# Patient Record
Sex: Female | Born: 1975 | Race: Black or African American | Hispanic: No | Marital: Single | Smoking: Current every day smoker
Health system: Southern US, Community
[De-identification: ages and names within clinical notes are randomized; demographics above are authoritative.]

## PROBLEM LIST (undated history)

## (undated) HISTORY — PX: ABDOMINAL SURGERY: SHX537

---

## 2016-03-12 ENCOUNTER — Encounter (HOSPITAL_COMMUNITY): Payer: Self-pay | Admitting: *Deleted

## 2016-03-12 ENCOUNTER — Emergency Department (HOSPITAL_COMMUNITY)
Admission: EM | Admit: 2016-03-12 | Discharge: 2016-03-13 | Disposition: A | Discharge status: Home / Self Care | Payer: Self-pay | Attending: Emergency Medicine | Admitting: Emergency Medicine

## 2016-03-12 DIAGNOSIS — M5442 Lumbago with sciatica, left side: Secondary | ICD-10-CM | POA: Insufficient documentation

## 2016-03-12 DIAGNOSIS — M5432 Sciatica, left side: Secondary | ICD-10-CM

## 2016-03-12 DIAGNOSIS — Y9301 Activity, walking, marching and hiking: Secondary | ICD-10-CM | POA: Insufficient documentation

## 2016-03-12 DIAGNOSIS — Y990 Civilian activity done for income or pay: Secondary | ICD-10-CM | POA: Insufficient documentation

## 2016-03-12 DIAGNOSIS — Y9289 Other specified places as the place of occurrence of the external cause: Secondary | ICD-10-CM | POA: Insufficient documentation

## 2016-03-12 DIAGNOSIS — W19XXXA Unspecified fall, initial encounter: Secondary | ICD-10-CM | POA: Insufficient documentation

## 2016-03-12 DIAGNOSIS — F17210 Nicotine dependence, cigarettes, uncomplicated: Secondary | ICD-10-CM | POA: Insufficient documentation

## 2016-03-12 LAB — COMPREHENSIVE METABOLIC PANEL
ALK PHOS: 48 U/L (ref 38–126)
ALT: 27 U/L (ref 14–54)
AST: 32 U/L (ref 15–41)
Albumin: 3.9 g/dL (ref 3.5–5.0)
Anion gap: 4 — ABNORMAL LOW (ref 5–15)
BUN: 9 mg/dL (ref 6–20)
CALCIUM: 8.8 mg/dL — AB (ref 8.9–10.3)
CO2: 25 mmol/L (ref 22–32)
CREATININE: 0.95 mg/dL (ref 0.44–1.00)
Chloride: 110 mmol/L (ref 101–111)
GFR calc non Af Amer: >60 mL/min (ref >60)
Glucose, Bld: 103 mg/dL — ABNORMAL HIGH (ref 65–99)
Potassium: 3.6 mmol/L (ref 3.5–5.1)
SODIUM: 139 mmol/L (ref 135–145)
Total Bilirubin: 0.7 mg/dL (ref 0.3–1.2)
Total Protein: 7 g/dL (ref 6.5–8.1)

## 2016-03-12 LAB — CBC WITH DIFFERENTIAL/PLATELET
Basophils Absolute: 0.1 10*3/uL (ref 0.0–0.1)
Basophils Relative: 1 %
Eosinophils Absolute: 0.2 10*3/uL (ref 0.0–0.7)
Eosinophils Relative: 3 %
HEMATOCRIT: 46.3 % — AB (ref 36.0–46.0)
HEMOGLOBIN: 15.6 g/dL — AB (ref 12.0–15.0)
LYMPHS ABS: 3.2 10*3/uL (ref 0.7–4.0)
LYMPHS PCT: 37 %
MCH: 30.6 pg (ref 26.0–34.0)
MCHC: 33.7 g/dL (ref 30.0–36.0)
MCV: 91 fL (ref 78.0–100.0)
Monocytes Absolute: 0.6 10*3/uL (ref 0.1–1.0)
Monocytes Relative: 7 %
NEUTROS ABS: 4.6 10*3/uL (ref 1.7–7.7)
NEUTROS PCT: 52 %
Platelets: 199 10*3/uL (ref 150–400)
RBC: 5.09 MIL/uL (ref 3.87–5.11)
RDW: 13.8 % (ref 11.5–15.5)
WBC: 8.7 10*3/uL (ref 4.0–10.5)

## 2016-03-12 LAB — URINALYSIS, ROUTINE W REFLEX MICROSCOPIC
GLUCOSE, UA: NEGATIVE mg/dL
Hgb urine dipstick: NEGATIVE
KETONES UR: 15 mg/dL — AB
NITRITE: NEGATIVE
PH: 6 (ref 5.0–8.0)
Protein, ur: NEGATIVE mg/dL
Specific Gravity, Urine: 1.035 — ABNORMAL HIGH (ref 1.005–1.030)

## 2016-03-12 LAB — URINE MICROSCOPIC-ADD ON: RBC / HPF: NONE SEEN RBC/hpf (ref 0–5)

## 2016-03-12 NOTE — ED Triage Notes (Signed)
No answer x3

## 2016-03-12 NOTE — ED Triage Notes (Signed)
Patient presents with c/o left leg hurting to the back of her knee.  States the pain gets so bad at times that she cannot sleep

## 2016-03-13 ENCOUNTER — Emergency Department (HOSPITAL_COMMUNITY): Payer: Self-pay

## 2016-03-13 LAB — PREGNANCY, URINE: Preg Test, Ur: NEGATIVE

## 2016-03-13 MED ORDER — NAPROXEN 500 MG PO TABS: 500.0000 mg | ORAL_TABLET | Freq: Two times a day (BID) | ORAL | 0 refills | Status: DC

## 2016-03-13 MED ORDER — NAPROXEN 250 MG PO TABS
500.0000 mg | ORAL_TABLET | Freq: Once | ORAL | Status: AC
Start: 2016-03-13 — End: 2016-03-13
  Administered 2016-03-13: 500 mg via ORAL
  Filled 2016-03-13: qty 2

## 2016-03-13 MED ORDER — HYDROCODONE-ACETAMINOPHEN 5-325 MG PO TABS: 1.0000 | ORAL_TABLET | ORAL | 0 refills | Status: DC | PRN

## 2016-03-13 NOTE — ED Notes (Signed)
The pt did not answer when her name was called  Now she is asking how long is the wait  She went out to her car to get something

## 2016-03-13 NOTE — ED Provider Notes (Signed)
[facility] DEPT Provider Note   CSN: [id number] Arrival date & time: 03/12/16  2003     History   Chief Complaint Chief Complaint  Patient presents with  . Leg Pain    HPI Victoria Browning is a 40 y.o. female.  Patient presents with complaint of pain in the left buttock that extends down the posterior left thigh to the proximal calf. No swelling or discoloration. No abdominal pain, urinary symptoms including no urinary incontinence or retention. She denies history of the same. 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 No numbness or weakness of the extremities.    The history is provided by the patient. No language interpreter was used.  Leg Pain   This is a new problem. The current episode started more than 1 week ago. The problem occurs constantly. The problem has been gradually worsening. Pertinent negatives include no numbness.    History reviewed. No pertinent past medical history.  There are no active problems to display for this patient.   Past Surgical History:  Procedure Laterality Date  . ABDOMINAL SURGERY      OB History    No data available       Home Medications    Prior to Admission medications   Not on File    Family History No family history on file.  Social History Social History  Substance Use Topics  . Smoking status: Current Every Day Smoker    Types: Cigarettes  . Smokeless tobacco: Never Used  . Alcohol use Yes     Comment: socially     Allergies   Review of patient's allergies indicates no known allergies.   Review of Systems Review of Systems  Constitutional: Negative for chills and fever.  HENT: Negative.   Respiratory: Negative.   Cardiovascular: Negative.   Gastrointestinal: Negative.   Musculoskeletal:       See HPI.  Skin: Negative.  Negative for color change and wound.  Neurological:  Negative.  Negative for weakness and numbness.     Physical Exam Updated Vital Signs BP 118/81   Pulse (!) 57   Temp 99 F (37.2 C) (Oral)   Resp 18   Ht 5\' 5"  (1.651 m)   Wt 99.8 kg   LMP 03/11/2016 Comment: spotting  transitioning taking shots  SpO2 99%   BMI 36.61 kg/m   Physical Exam  Constitutional: She is oriented to person, place, and time. She appears well-developed and well-nourished.  Neck: Normal range of motion.  Pulmonary/Chest: Effort normal.  Abdominal: There is no tenderness.  Musculoskeletal: Normal range of motion.  Left leg has a preserved ROM. There is no swelling, redness or deformity. Tender to left buttock that causes radiating pain into the left leg. Minor lumbar and left paralumbar tenderness.   Neurological: She is alert and oriented to person, place, and time.  Positive straight leg raise on left. Normal and symmetric strength and sensation to light touch full length of LE's.   Skin: Skin is warm and dry.     ED Treatments / Results  Labs (all labs ordered are listed, but only abnormal results are displayed) Labs Reviewed  CBC WITH DIFFERENTIAL/PLATELET - Abnormal; Notable for the following:       Result Value   Hemoglobin 15.6 (*)    HCT 46.3 (*)    All other components within normal limits  COMPREHENSIVE METABOLIC PANEL - Abnormal; Notable for the following:    Glucose, Bld 103 (*)    Calcium 8.8 (*)    Anion gap 4 (*)    All other components within normal limits  URINALYSIS, ROUTINE W REFLEX MICROSCOPIC (NOT AT [facility]) - Abnormal; Notable for the following:    Color, Urine AMBER (*)    Specific Gravity, Urine 1.035 (*)    Bilirubin Urine SMALL (*)    Ketones, ur 15 (*)    Leukocytes, UA SMALL (*)    All other components within normal limits  URINE MICROSCOPIC-ADD ON - Abnormal; Notable for the following:    Squamous Epithelial / LPF 0-5 (*)    Bacteria, UA RARE (*)    All other components within normal limits  POC URINE PREG, ED     EKG  EKG Interpretation None       Radiology No results found.  Procedures Procedures (including critical care time)  Medications Ordered in ED Medications - No data to display   Initial Impression / Assessment and Plan / ED Course  I have reviewed the triage vital signs and the nursing notes.  Pertinent labs & imaging results that were available during my care of the patient were reviewed by me and considered in my medical decision making (see chart for details).  Clinical Course    Patient with low back discomfort and radiating pain into left leg, c/w sciatic distribution. Negative lumbar imaging, no compression. No neurologic deficits on exam. The patient is felt stable for discharge home with PCP follow up recommended.   Final Clinical Impressions(s) / ED Diagnoses   Final diagnoses:  None   1. Left sciatica  New Prescriptions New Prescriptions   No medications on file     Elpidio AnisShari Nitisha Civello, PA-C 03/13/16 0500    Jacalyn LefevreJulie Haviland, MD 03/13/16 [phone]

## 2016-04-11 ENCOUNTER — Encounter (HOSPITAL_COMMUNITY): Payer: Self-pay

## 2016-04-11 ENCOUNTER — Emergency Department (HOSPITAL_COMMUNITY)
Admission: EM | Admit: 2016-04-11 | Discharge: 2016-04-11 | Disposition: A | Discharge status: Home / Self Care | Payer: Self-pay | Attending: Emergency Medicine | Admitting: Emergency Medicine

## 2016-04-11 DIAGNOSIS — Y990 Civilian activity done for income or pay: Secondary | ICD-10-CM | POA: Insufficient documentation

## 2016-04-11 DIAGNOSIS — M5442 Lumbago with sciatica, left side: Secondary | ICD-10-CM | POA: Insufficient documentation

## 2016-04-11 DIAGNOSIS — W19XXXA Unspecified fall, initial encounter: Secondary | ICD-10-CM | POA: Insufficient documentation

## 2016-04-11 DIAGNOSIS — Y929 Unspecified place or not applicable: Secondary | ICD-10-CM | POA: Insufficient documentation

## 2016-04-11 DIAGNOSIS — F17210 Nicotine dependence, cigarettes, uncomplicated: Secondary | ICD-10-CM | POA: Insufficient documentation

## 2016-04-11 DIAGNOSIS — Y939 Activity, unspecified: Secondary | ICD-10-CM | POA: Insufficient documentation

## 2016-04-11 DIAGNOSIS — M5432 Sciatica, left side: Secondary | ICD-10-CM

## 2016-04-11 MED ORDER — CYCLOBENZAPRINE HCL 10 MG PO TABS: 10.0000 mg | ORAL_TABLET | Freq: Two times a day (BID) | ORAL | 0 refills | Status: DC | PRN

## 2016-04-11 MED ORDER — PREDNISONE 20 MG PO TABS: 40.0000 mg | ORAL_TABLET | Freq: Every day | ORAL | 0 refills | Status: AC

## 2016-04-11 NOTE — ED Notes (Signed)
Declined W/C at D/C and was escorted to lobby by RN. 

## 2016-04-11 NOTE — ED Provider Notes (Signed)
[facility] Provider Note   CSN: [id number] Arrival date & time: 04/11/16  1545   By signing my name below, I, Victoria Browning, attest that this documentation has been prepared under the direction and in the presence of Roxy Horseman, PA-C. Electronically Signed: Christel Browning, Scribe. 04/11/2016. 5:04 PM.   History   Chief Complaint Chief Complaint  Patient presents with  . Back Pain    The history is provided by the patient. No language interpreter was used.   HPI Comments:  Victoria Browning is a 40 y.o. female who presents to the Emergency Department complaining of ongoing L sided back pain with radiation down her L leg that began 1 month ago. Pt states that she fell at work and 2 works later the pain began. Pt states that currently the pain in primarily in her leg and in her side. Pt states that she visited the ED at the onset of the pain and was given pill with no relief. Pt denies bowel/bladded incontinence and IV drug use.   History reviewed. No pertinent past medical history.  There are no active problems to display for this patient.   Past Surgical History:  Procedure Laterality Date  . ABDOMINAL SURGERY      OB History    No data available       Home Medications    Prior to Admission medications   Medication Sig Start Date End Date Taking? Authorizing Provider  HYDROcodone-acetaminophen (NORCO/VICODIN) 5-325 MG tablet Take 1-2 tablets by mouth every 4 (four) hours as needed. 03/13/16   Elpidio Anis, PA-C  ibuprofen (ADVIL,MOTRIN) 200 MG tablet Take 400 mg by mouth every 6 (six) hours as needed for mild pain or moderate pain.    Historical Provider, MD  Menthol (ICY HOT) 5 % PTCH Apply 1 patch topically as needed (pain).    Historical Provider, MD  Menthol-Camphor (ICY HOT ADVANCED RELIEF) 16-11 % CREA Apply 1 application topically as needed (pain).    Historical Provider, MD  naproxen (NAPROSYN) 500 MG tablet Take 1 tablet (500 mg total) by mouth 2  (two) times daily. 03/13/16   Elpidio Anis, PA-C  testosterone cypionate (DEPOTESTOTERONE CYPIONATE) 100 MG/ML injection Inject 45 mg into the muscle once a week. For IM use only    Historical Provider, MD    Family History No family history on file.  Social History Social History  Substance Use Topics  . Smoking status: Current Every Day Smoker    Types: Cigarettes  . Smokeless tobacco: Never Used  . Alcohol use Yes     Comment: socially     Allergies   Review of patient's allergies indicates no known allergies.   Review of Systems Review of Systems  Gastrointestinal:       No bowel incontinence.   Genitourinary:       No bladder incontinence.   Musculoskeletal: Positive for myalgias.     Physical Exam Updated Vital Signs BP 100/81 (BP Location: Left Arm)   Pulse 88   Temp 99 F (37.2 C) (Oral)   Resp 18   LMP 03/11/2016 Comment: spotting  transitioning taking shots  SpO2 100%   Physical Exam Physical Exam  Constitutional: Pt appears well-developed and well-nourished. No distress.  HENT:  Head: Normocephalic and atraumatic.  Mouth/Throat: Oropharynx is clear and moist. No oropharyngeal exudate.  Eyes: Conjunctivae are normal.  Neck: Normal range of motion. Neck supple.  No meningismus Cardiovascular: Normal rate, regular rhythm and intact distal pulses.   Pulmonary/Chest: Effort  normal and breath sounds normal. No respiratory distress. Pt has no wheezes.  Abdominal: Pt exhibits no distension Musculoskeletal:  Lumbar paraspinal muscles tender to palpation, no bony CTLS spine tenderness, deformity, step-off, or crepitus Lymphadenopathy: Pt has no cervical adenopathy.  Neurological: Pt is alert and oriented Speech is clear and goal oriented, follows commands Normal 5/5 strength in upper and lower extremities bilaterally including dorsiflexion and plantar flexion, strong and equal grip strength Sensation intact Great toe extension intact Moves extremities  without ataxia, coordination intact Ankle and knee jerk reflexes intact and symmetrical  Normal gait Normal balance No Clonus Skin: Skin is warm and dry. No rash noted. Pt is not diaphoretic. No erythema.  Psychiatric: Pt has a normal mood and affect. Behavior is normal.  Nursing note and vitals reviewed.   ED Treatments / Results  DIAGNOSTIC STUDIES:  Oxygen Saturation is 100% on RA, normal by my interpretation.    COORDINATION OF CARE:  5:06 PM Discussed treatment plan with pt at bedside and pt agreed to plan.   Labs (all labs ordered are listed, but only abnormal results are displayed) Labs Reviewed - No data to display  EKG  EKG Interpretation None       Radiology No results found.  Procedures Procedures (including critical care time)  Medications Ordered in ED Medications - No data to display   Initial Impression / Assessment and Plan / ED Course  I have reviewed the triage vital signs and the nursing notes.  Pertinent labs & imaging results that were available during my care of the patient were reviewed by me and considered in my medical decision making (see chart for details).  Clinical Course    Patient with back pain.  No neurological deficits and normal neuro exam.  Patient is ambulatory.  No loss of bowel or bladder control.  Doubt cauda equina.  Denies fever,  doubt epidural abscess or other lesion. Recommend back exercises, stretching, RICE, and will treat with a short course of prednisone.  Encouraged the patient that there could be a need for additional workup and/or imaging such as MRI, if the symptoms do not resolve. Patient advised that if the back pain does not resolve, or radiates, this could progress to more serious conditions and is encouraged to follow-up with PCP or orthopedics within 2 weeks.     Final Clinical Impressions(s) / ED Diagnoses   Final diagnoses:  Sciatica of left side    New Prescriptions Discharge Medication List as  of 04/11/2016  5:28 PM    START taking these medications   Details  cyclobenzaprine (FLEXERIL) 10 MG tablet Take 1 tablet (10 mg total) by mouth 2 (two) times daily as needed for muscle spasms., Starting Sun 04/11/2016, Print    predniSONE (DELTASONE) 20 MG tablet Take 2 tablets (40 mg total) by mouth daily., Starting Sun 04/11/2016, Print       I personally performed the services described in this documentation, which was scribed in my presence. The recorded information has been reviewed and is accurate.       Roxy Horsemanobert Aries Townley, PA-C 04/11/16 1855    Doug SouSam Jacubowitz, MD 04/12/16 [phone]

## 2016-04-11 NOTE — ED Triage Notes (Signed)
Patient complains of ongoing left sided back pain with radiation down leg, took meds as prescribed with no relief

## 2016-05-22 ENCOUNTER — Encounter (HOSPITAL_COMMUNITY): Payer: Self-pay | Admitting: *Deleted

## 2016-05-22 ENCOUNTER — Emergency Department (HOSPITAL_COMMUNITY)
Admission: EM | Admit: 2016-05-22 | Discharge: 2016-05-22 | Disposition: A | Discharge status: Home / Self Care | Payer: Self-pay | Attending: Emergency Medicine | Admitting: Emergency Medicine

## 2016-05-22 ENCOUNTER — Other Ambulatory Visit: Payer: Self-pay

## 2016-05-22 ENCOUNTER — Emergency Department (HOSPITAL_COMMUNITY): Payer: Self-pay

## 2016-05-22 DIAGNOSIS — M5106 Intervertebral disc disorders with myelopathy, lumbar region: Secondary | ICD-10-CM | POA: Insufficient documentation

## 2016-05-22 DIAGNOSIS — M5126 Other intervertebral disc displacement, lumbar region: Secondary | ICD-10-CM

## 2016-05-22 DIAGNOSIS — M5410 Radiculopathy, site unspecified: Secondary | ICD-10-CM | POA: Insufficient documentation

## 2016-05-22 DIAGNOSIS — F17210 Nicotine dependence, cigarettes, uncomplicated: Secondary | ICD-10-CM | POA: Insufficient documentation

## 2016-05-22 LAB — RAPID URINE DRUG SCREEN, HOSP PERFORMED
AMPHETAMINES: NOT DETECTED
BENZODIAZEPINES: NOT DETECTED
Barbiturates: NOT DETECTED
COCAINE: NOT DETECTED
OPIATES: NOT DETECTED
TETRAHYDROCANNABINOL: POSITIVE — AB

## 2016-05-22 LAB — LIPASE, BLOOD: Lipase: 25 U/L (ref 11–51)

## 2016-05-22 LAB — URINE MICROSCOPIC-ADD ON

## 2016-05-22 LAB — URINALYSIS, ROUTINE W REFLEX MICROSCOPIC
Bilirubin Urine: NEGATIVE
Glucose, UA: NEGATIVE mg/dL
Ketones, ur: NEGATIVE mg/dL
LEUKOCYTES UA: NEGATIVE
NITRITE: NEGATIVE
PROTEIN: NEGATIVE mg/dL
SPECIFIC GRAVITY, URINE: 1.029 (ref 1.005–1.030)
pH: 5.5 (ref 5.0–8.0)

## 2016-05-22 LAB — COMPREHENSIVE METABOLIC PANEL
ALK PHOS: 43 U/L (ref 38–126)
ALT: 18 U/L (ref 14–54)
ANION GAP: 8 (ref 5–15)
AST: 29 U/L (ref 15–41)
Albumin: 3.9 g/dL (ref 3.5–5.0)
BUN: 9 mg/dL (ref 6–20)
CALCIUM: 9 mg/dL (ref 8.9–10.3)
CO2: 23 mmol/L (ref 22–32)
CREATININE: 0.85 mg/dL (ref 0.44–1.00)
Chloride: 109 mmol/L (ref 101–111)
Glucose, Bld: 109 mg/dL — ABNORMAL HIGH (ref 65–99)
Potassium: 3.1 mmol/L — ABNORMAL LOW (ref 3.5–5.1)
SODIUM: 140 mmol/L (ref 135–145)
TOTAL PROTEIN: 7.2 g/dL (ref 6.5–8.1)
Total Bilirubin: 0.7 mg/dL (ref 0.3–1.2)

## 2016-05-22 LAB — CBC
HCT: 39.2 % (ref 36.0–46.0)
HEMOGLOBIN: 13.6 g/dL (ref 12.0–15.0)
MCH: 30.4 pg (ref 26.0–34.0)
MCHC: 34.7 g/dL (ref 30.0–36.0)
MCV: 87.5 fL (ref 78.0–100.0)
PLATELETS: 247 10*3/uL (ref 150–400)
RBC: 4.48 MIL/uL (ref 3.87–5.11)
RDW: 13 % (ref 11.5–15.5)
WBC: 10 10*3/uL (ref 4.0–10.5)

## 2016-05-22 MED ORDER — KETOROLAC TROMETHAMINE 30 MG/ML IJ SOLN
15.0000 mg | Freq: Once | INTRAMUSCULAR | Status: AC
  Administered 2016-05-22: 15 mg via INTRAMUSCULAR
  Filled 2016-05-22: qty 1

## 2016-05-22 MED ORDER — HYDROCODONE-ACETAMINOPHEN 5-325 MG PO TABS: 1.0000 | ORAL_TABLET | ORAL | 0 refills | Status: AC | PRN

## 2016-05-22 MED ORDER — IBUPROFEN 600 MG PO TABS: 600.0000 mg | ORAL_TABLET | Freq: Four times a day (QID) | ORAL | 0 refills | Status: DC | PRN

## 2016-05-22 MED ORDER — OXYCODONE-ACETAMINOPHEN 5-325 MG PO TABS
2.0000 | ORAL_TABLET | Freq: Once | ORAL | Status: AC
  Administered 2016-05-22: 2 via ORAL
  Filled 2016-05-22: qty 2

## 2016-05-22 NOTE — ED Notes (Signed)
Patient taken to MRI

## 2016-05-22 NOTE — ED Triage Notes (Signed)
The pt is c/o lt flank and lt sided pain for 2 months  Its not getting any better  She had a fall 2 months ago pain has been increasing since then  She has been seen here for the same  lmp yesterday

## 2016-05-22 NOTE — ED Provider Notes (Signed)
[facility] Provider Note   CSN: [id number] Arrival date & time: 05/22/16  [id number]     History   Chief Complaint Chief Complaint  Patient presents with  . Flank Pain    HPI Victoria Browning is a 40 y.o. female.  HPI The pt is c/o lt flank and lt sided pain for 2 months  Its not getting any better  She had a fall 2 months ago pain has been increasing since then  She has been seen here for the same  lmp yesterday History reviewed. No pertinent past medical history.  There are no active problems to display for this patient.   Past Surgical History:  Procedure Laterality Date  . ABDOMINAL SURGERY      OB History    No data available       Home Medications    Prior to Admission medications   Medication Sig Start Date End Date Taking? Authorizing Provider  cyclobenzaprine (FLEXERIL) 10 MG tablet Take 1 tablet (10 mg total) by mouth 2 (two) times daily as needed for muscle spasms. Patient not taking: Reported on 05/22/2016 04/11/16   Roxy Horsemanobert Browning, PA-C  HYDROcodone-acetaminophen (NORCO/VICODIN) 5-325 MG tablet Take 1-2 tablets by mouth every 4 (four) hours as needed. 05/22/16   Nelva Nayobert Dianelys Scinto, MD  ibuprofen (ADVIL,MOTRIN) 600 MG tablet Take 1 tablet (600 mg total) by mouth every 6 (six) hours as needed. 05/22/16   Nelva Nayobert Cataleya Cristina, MD  naproxen (NAPROSYN) 500 MG tablet Take 1 tablet (500 mg total) by mouth 2 (two) times daily. Patient not taking: Reported on 05/22/2016 03/13/16   Elpidio AnisShari Upstill, PA-C  predniSONE (DELTASONE) 20 MG tablet Take 2 tablets (40 mg total) by mouth daily. Patient not taking: Reported on 05/22/2016 04/11/16   Roxy Horsemanobert Browning, PA-C    Family History No family history on file.  Social History Social History  Substance Use Topics  . Smoking status: Current Every Day Smoker    Types: Cigarettes  . Smokeless tobacco: Never Used  . Alcohol use Yes     Comment: socially     Allergies   Review of patient's allergies indicates no known  allergies.   Review of Systems Review of Systems   Physical Exam Updated Vital Signs BP 116/76   Pulse 83   Temp 98.2 F (36.8 C) (Oral)   Resp 20   LMP 05/21/2016   SpO2 97%   Physical Exam  Constitutional: She is oriented to person, place, and time. She appears well-developed and well-nourished. No distress.  HENT:  Head: Normocephalic and atraumatic.  Eyes: Pupils are equal, round, and reactive to light.  Neck: Normal range of motion.  Cardiovascular: Normal rate and intact distal pulses.   Pulmonary/Chest: No respiratory distress.  Abdominal: Normal appearance. She exhibits no distension.  Musculoskeletal: Normal range of motion.       Back:  Neurological: She is alert and oriented to person, place, and time. No cranial nerve deficit.  Skin: Skin is warm and dry. No rash noted.  Psychiatric: She has a normal mood and affect. Her behavior is normal.  Nursing note and vitals reviewed.    ED Treatments / Results  Labs (all labs ordered are listed, but only abnormal results are displayed) Labs Reviewed  COMPREHENSIVE METABOLIC PANEL - Abnormal; Notable for the following:       Result Value   Potassium 3.1 (*)    Glucose, Bld 109 (*)    All other components within normal limits  URINALYSIS, ROUTINE W REFLEX MICROSCOPIC (  NOT AT [facility]) - Abnormal; Notable for the following:    Hgb urine dipstick MODERATE (*)    All other components within normal limits  RAPID URINE DRUG SCREEN, HOSP PERFORMED - Abnormal; Notable for the following:    Tetrahydrocannabinol POSITIVE (*)    All other components within normal limits  URINE MICROSCOPIC-ADD ON - Abnormal; Notable for the following:    Squamous Epithelial / LPF 0-5 (*)    Bacteria, UA RARE (*)    All other components within normal limits  LIPASE, BLOOD  CBC    EKG  EKG Interpretation  Date/Time:  Saturday May 22 2016 18:43:38 EDT Ventricular Rate:  95 PR Interval:  130 QRS Duration: 84 QT Interval:  332 QTC  Calculation: 417 R Axis:   93 Text Interpretation:  Normal sinus rhythm Rightward axis Nonspecific ST and T wave abnormality Abnormal ECG Confirmed by Carina Chaplin  MD, Drew Lips ([id number]) on 05/22/2016 8:07:35 PM       Radiology Mr Lumbar Spine Wo Contrast  Result Date: 05/22/2016 CLINICAL DATA:  Initial evaluation for low back pain with left lower extremity radicular symptoms. EXAM: MRI LUMBAR SPINE WITHOUT CONTRAST TECHNIQUE: Multiplanar, multisequence MR imaging of the lumbar spine was performed. No intravenous contrast was administered. COMPARISON:  Prior radiograph from 03/13/2016. FINDINGS: Segmentation: Normal segmentation. Lowest well-formed disc is labeled the L5-S1 level. Alignment: Straightening of the normal lumbar lordosis. Trace 2 mm anterolisthesis of L4 on L5. Vertebrae: Vertebral body height maintained. No evidence for acute or chronic fracture. Signal intensity within the vertebral body bone marrow within normal limits. Conus medullaris: Extends to the L1-2 level and appears normal. Paraspinal and other soft tissues: Paraspinous soft tissues within normal limits. Visualized visceral structures unremarkable. Disc levels: No significant degenerative changes are seen through the L3-4 level. L4-5: Trace 2 mm anterolisthesis of L4 on L5. Mild diffuse disc bulge with disc desiccation. Superimposed shallow left foraminal/far lateral disc protrusion (series 7, image 24), potentially irritating the exiting left L4 nerve root. No frank neural impingement. Superimposed mild facet arthrosis. No significant canal stenosis. Mild left foraminal narrowing. L5-S1: Diffuse degenerative disc bulge with disc desiccation and intervertebral disc space narrowing. Left subarticular disc protrusion extending into the left lateral recess and impinging upon the transiting left S1 nerve root (series 6, image 8). Mild lateral facet arthrosis. No significant canal or foraminal encroachment. IMPRESSION: 1. Left subarticular  disc protrusion at L5-S1 impinging upon the left S1 nerve root in the left lateral recess. 2. Shallow left foraminal/far lateral disc protrusion at L4-5, closely approximating and potentially irritating the exiting left L4 nerve root. Electronically Signed   By: Rise MuBenjamin  McClintock M.D.   On: 05/22/2016 22:47    Procedures Procedures (including critical care time)  Medications Ordered in ED Medications  ketorolac (TORADOL) 30 MG/ML injection 15 mg (not administered)  oxyCODONE-acetaminophen (PERCOCET/ROXICET) 5-325 MG per tablet 2 tablet (not administered)     Initial Impression / Assessment and Plan / ED Course  I have reviewed the triage vital signs and the nursing notes.  Pertinent labs & imaging results that were available during my care of the patient were reviewed by me and considered in my medical decision making (see chart for details).  Clinical Course      Final Clinical Impressions(s) / ED Diagnoses   Final diagnoses:  Radicular pain of left lower extremity  Herniated intervertebral disc of lumbar spine    New Prescriptions New Prescriptions   IBUPROFEN (ADVIL,MOTRIN) 600 MG TABLET    Take  1 tablet (600 mg total) by mouth every 6 (six) hours as needed.     Nelva Nay, MD 05/22/16 [phone]

## 2017-05-30 IMAGING — MR MR LUMBAR SPINE W/O CM
4 of 6 series · 18 of 48 positions shown · non-contrast
Comparison: Prior radiograph from 03/13/2016.

CLINICAL DATA: Initial evaluation for low back pain with left lower
extremity radicular symptoms.

EXAM:
MRI LUMBAR SPINE WITHOUT CONTRAST
TECHNIQUE: Multiplanar, multisequence MR imaging of the lumbar spine was
performed. No intravenous contrast was administered.

[Series 3: T2 · sagittal · 5.0mm · 0.55mm/px · 4 of 14 slices shown (1 of 2)]
[im 1/14]
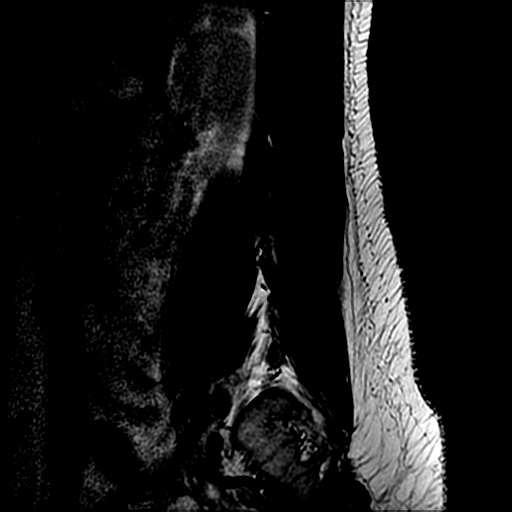
[im 5/14]
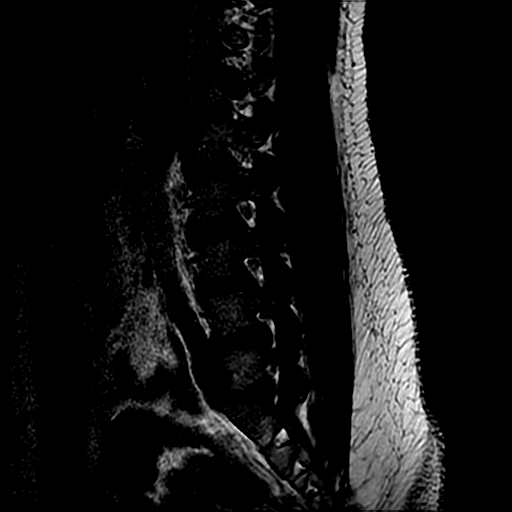
[im 9/14]
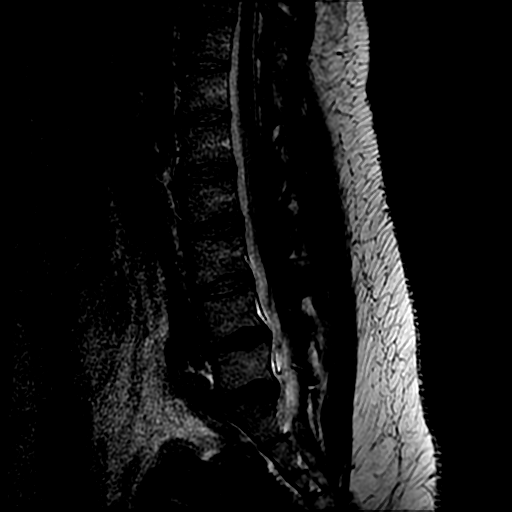
[im 14/14]
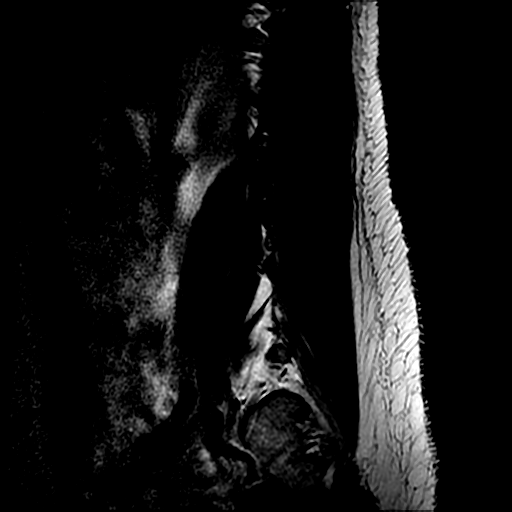

[Series 4: T1 · sagittal · 5.0mm · 0.55mm/px · 3 of 14 slices shown (1 of 2)]
[im 1/14]
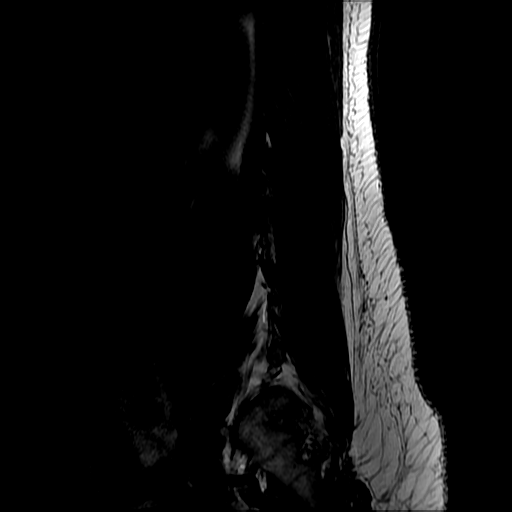
[im 9/14]
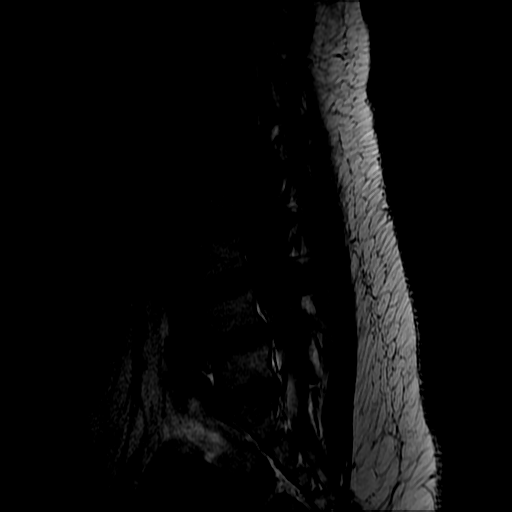
[im 14/14]
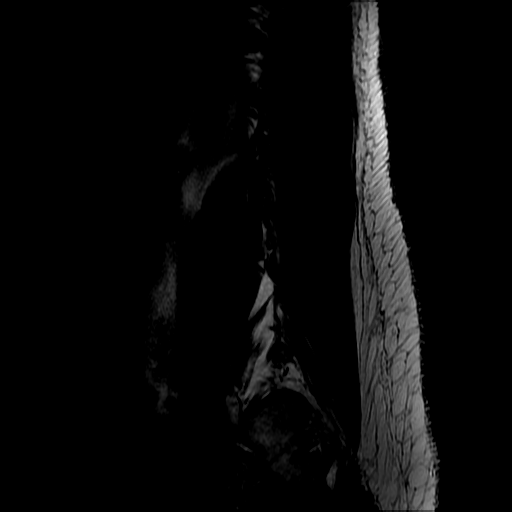

[Series 6: T2 · axial · 5.0mm · 0.39mm/px · z∈[-112,+55]mm · 8 of 36 slices shown (2 of 2)]
[im 1/36]
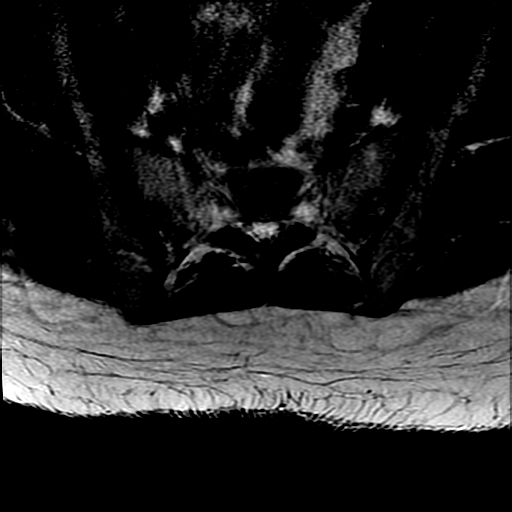
[im 7/36]
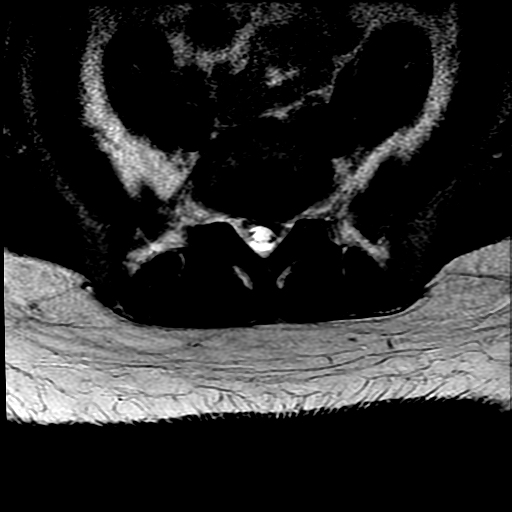
[im 10/36]
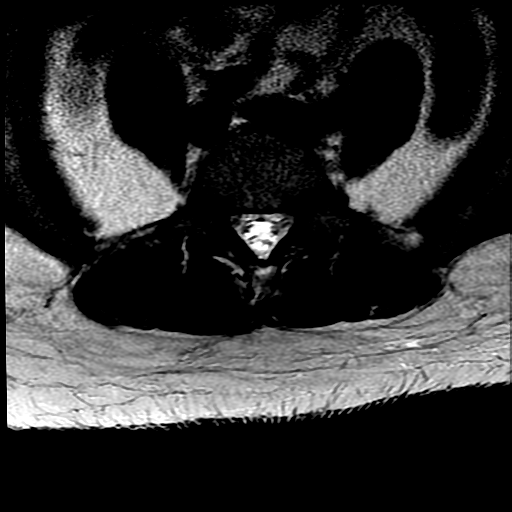
[im 16/36]
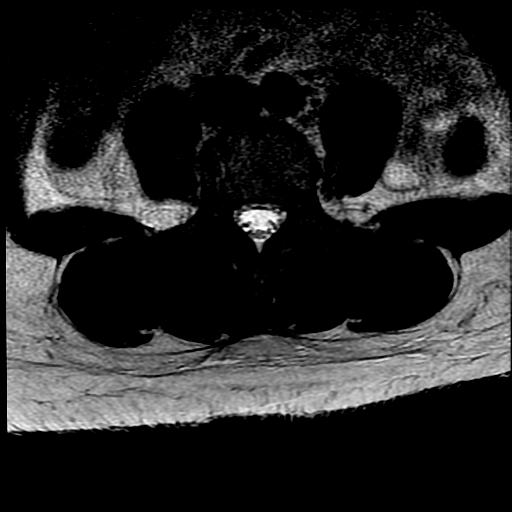
[im 20/36]
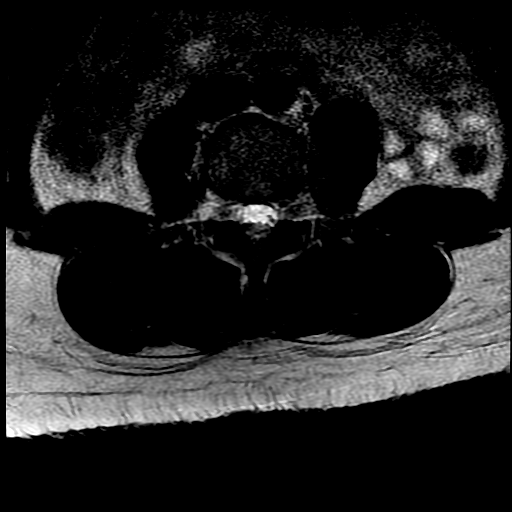
[im 26/36]
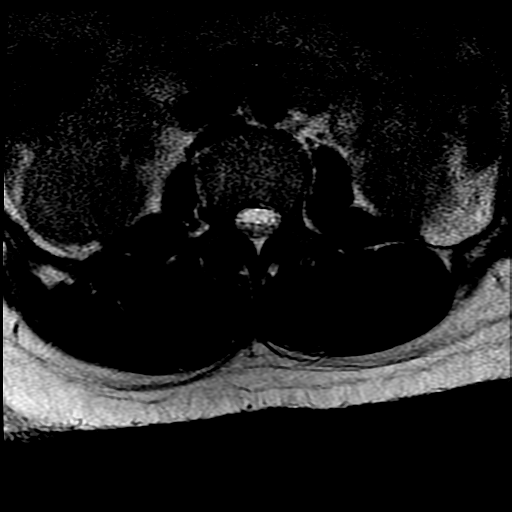
[im 29/36]
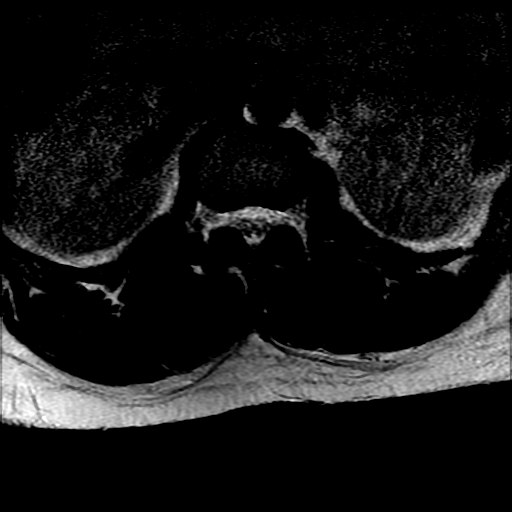
[im 32/36]
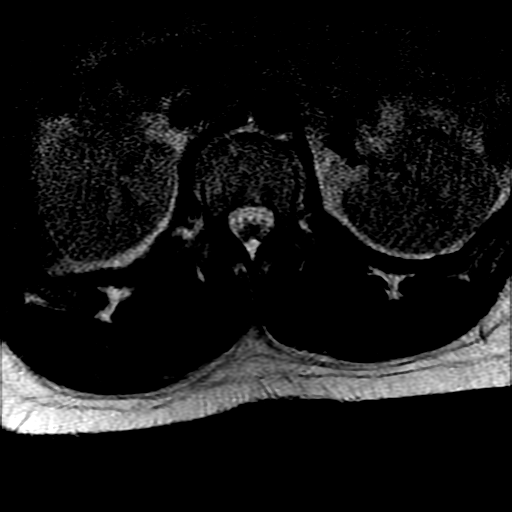

[Series 7: T1 · axial · 5.0mm · 0.39mm/px · z∈[-82,+55]mm · 3 of 36 slices shown (2 of 2)]
[im 7/36]
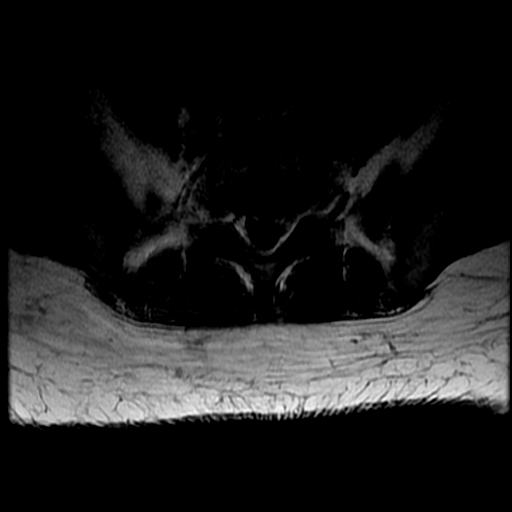
[im 20/36]
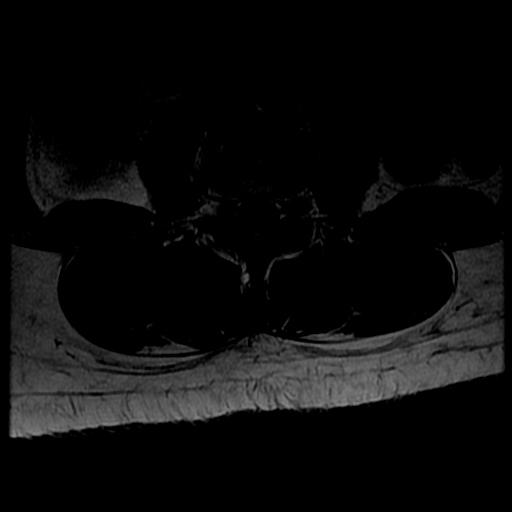
[im 32/36]
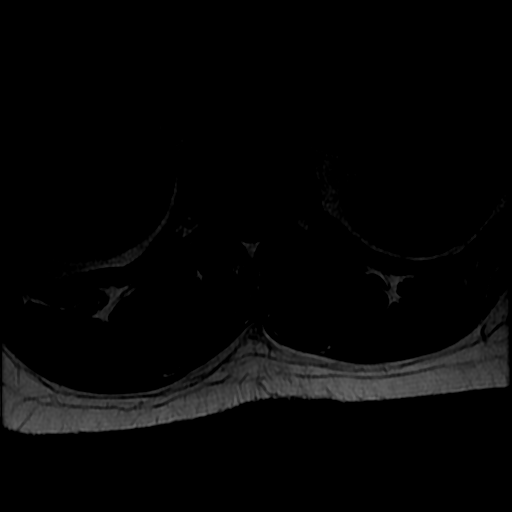

[18 of 48 positions shown; findings below may reference images not displayed]

FINDINGS: Segmentation: Normal segmentation. Lowest well-formed disc is
labeled the L5-S1 level.

Alignment: Straightening of the normal lumbar lordosis. Trace 2 mm
anterolisthesis of L4 on L5.

Vertebrae: Vertebral body height maintained. No evidence for acute
or chronic fracture. Signal intensity within the vertebral body bone
marrow within normal limits.

Conus medullaris: Extends to the L1-2 level and appears normal.

Paraspinal and other soft tissues: Paraspinous soft tissues within
normal limits. Visualized visceral structures unremarkable.

Disc levels:

No significant degenerative changes are seen through the L3-4 level.

L4-5: Trace 2 mm anterolisthesis of L4 on L5. Mild diffuse disc
bulge with disc desiccation. Superimposed shallow left foraminal/far
lateral disc protrusion (series 7, image 24), potentially irritating
the exiting left L4 nerve root. No frank neural impingement.
Superimposed mild facet arthrosis. No significant canal stenosis.
Mild left foraminal narrowing.

L5-S1: Diffuse degenerative disc bulge with disc desiccation and
intervertebral disc space narrowing. Left subarticular disc
protrusion extending into the left lateral recess and impinging upon
the transiting left S1 nerve root (series 6, image 8). Mild lateral
facet arthrosis. No significant canal or foraminal encroachment.
IMPRESSION: 1. Left subarticular disc protrusion at L5-S1 impinging upon the
left S1 nerve root in the left lateral recess.
2. Shallow left foraminal/far lateral disc protrusion at L4-5,
closely approximating and potentially irritating the exiting left L4
nerve root.

## 2018-03-09 ENCOUNTER — Emergency Department (HOSPITAL_COMMUNITY)
Admission: EM | Admit: 2018-03-09 | Discharge: 2018-03-10 | Disposition: A | Discharge status: Home / Self Care | Payer: Self-pay | Attending: Emergency Medicine | Admitting: Emergency Medicine

## 2018-03-09 ENCOUNTER — Encounter (HOSPITAL_COMMUNITY): Payer: Self-pay | Admitting: Emergency Medicine

## 2018-03-09 DIAGNOSIS — F17210 Nicotine dependence, cigarettes, uncomplicated: Secondary | ICD-10-CM | POA: Insufficient documentation

## 2018-03-09 DIAGNOSIS — N10 Acute tubulo-interstitial nephritis: Secondary | ICD-10-CM | POA: Insufficient documentation

## 2018-03-09 DIAGNOSIS — R3129 Other microscopic hematuria: Secondary | ICD-10-CM | POA: Insufficient documentation

## 2018-03-09 DIAGNOSIS — N12 Tubulo-interstitial nephritis, not specified as acute or chronic: Secondary | ICD-10-CM

## 2018-03-09 DIAGNOSIS — R112 Nausea with vomiting, unspecified: Secondary | ICD-10-CM | POA: Insufficient documentation

## 2018-03-09 LAB — CBC
HEMATOCRIT: 44.1 % (ref 36.0–46.0)
HEMOGLOBIN: 14.8 g/dL (ref 12.0–15.0)
MCH: 30.8 pg (ref 26.0–34.0)
MCHC: 33.6 g/dL (ref 30.0–36.0)
MCV: 91.9 fL (ref 78.0–100.0)
Platelets: 192 10*3/uL (ref 150–400)
RBC: 4.8 MIL/uL (ref 3.87–5.11)
RDW: 12.8 % (ref 11.5–15.5)
WBC: 14.7 10*3/uL — ABNORMAL HIGH (ref 4.0–10.5)

## 2018-03-09 MED ORDER — ACETAMINOPHEN 325 MG PO TABS
650.0000 mg | ORAL_TABLET | Freq: Once | ORAL | Status: AC | PRN
  Administered 2018-03-09: 650 mg via ORAL
  Filled 2018-03-09: qty 2

## 2018-03-09 NOTE — ED Triage Notes (Signed)
Pt c/o nausea/vomiting/abdominal pain x 1 day. Reports fevers at home, and pain with urination.

## 2018-03-10 ENCOUNTER — Emergency Department (HOSPITAL_COMMUNITY): Payer: Self-pay

## 2018-03-10 LAB — URINALYSIS, ROUTINE W REFLEX MICROSCOPIC
BILIRUBIN URINE: NEGATIVE
Glucose, UA: NEGATIVE mg/dL
KETONES UR: 20 mg/dL — AB
NITRITE: NEGATIVE
PH: 5 (ref 5.0–8.0)
Protein, ur: 30 mg/dL — AB
RBC / HPF: >50 RBC/hpf — ABNORMAL HIGH (ref 0–5)
Specific Gravity, Urine: 1.018 (ref 1.005–1.030)
WBC, UA: >50 WBC/hpf — ABNORMAL HIGH (ref 0–5)

## 2018-03-10 LAB — I-STAT BETA HCG BLOOD, ED (MC, WL, AP ONLY)

## 2018-03-10 LAB — I-STAT CG4 LACTIC ACID, ED
Lactic Acid, Venous: 0.97 mmol/L (ref 0.5–1.9)
Lactic Acid, Venous: 2.33 mmol/L (ref 0.5–1.9)

## 2018-03-10 LAB — COMPREHENSIVE METABOLIC PANEL
ALK PHOS: 47 U/L (ref 38–126)
ALT: 23 U/L (ref 0–44)
AST: 22 U/L (ref 15–41)
Albumin: 3.6 g/dL (ref 3.5–5.0)
Anion gap: 11 (ref 5–15)
BUN: 11 mg/dL (ref 6–20)
CO2: 21 mmol/L — AB (ref 22–32)
CREATININE: 1.11 mg/dL — AB (ref 0.44–1.00)
Calcium: 8.7 mg/dL — ABNORMAL LOW (ref 8.9–10.3)
Chloride: 105 mmol/L (ref 98–111)
GFR calc non Af Amer: >60 mL/min (ref >60)
Glucose, Bld: 151 mg/dL — ABNORMAL HIGH (ref 70–99)
Potassium: 3.5 mmol/L (ref 3.5–5.1)
Sodium: 137 mmol/L (ref 135–145)
Total Bilirubin: 1.6 mg/dL — ABNORMAL HIGH (ref 0.3–1.2)
Total Protein: 7 g/dL (ref 6.5–8.1)

## 2018-03-10 LAB — LIPASE, BLOOD: Lipase: 29 U/L (ref 11–51)

## 2018-03-10 MED ORDER — CEPHALEXIN 500 MG PO CAPS: 500.0000 mg | ORAL_CAPSULE | Freq: Four times a day (QID) | ORAL | 0 refills | Status: DC

## 2018-03-10 MED ORDER — KETOROLAC TROMETHAMINE 30 MG/ML IJ SOLN
30.0000 mg | Freq: Once | INTRAMUSCULAR | Status: AC
  Administered 2018-03-10: 30 mg via INTRAVENOUS
  Filled 2018-03-10: qty 1

## 2018-03-10 MED ORDER — IBUPROFEN 600 MG PO TABS: 600.0000 mg | ORAL_TABLET | Freq: Four times a day (QID) | ORAL | 0 refills | Status: AC | PRN

## 2018-03-10 MED ORDER — PROMETHAZINE HCL 25 MG PO TABS: 25.0000 mg | ORAL_TABLET | Freq: Four times a day (QID) | ORAL | 0 refills | Status: DC | PRN

## 2018-03-10 MED ORDER — SODIUM CHLORIDE 0.9 % IV BOLUS
1000.0000 mL | Freq: Once | INTRAVENOUS | Status: AC
Start: 2018-03-10 — End: 2018-03-10
  Administered 2018-03-10: 1000 mL via INTRAVENOUS

## 2018-03-10 MED ORDER — SODIUM CHLORIDE 0.9 % IV SOLN
1.0000 g | Freq: Once | INTRAVENOUS | Status: AC
  Administered 2018-03-10: 1 g via INTRAVENOUS
  Filled 2018-03-10: qty 10

## 2018-03-10 NOTE — ED Provider Notes (Signed)
[facility] Note   CSN: [id number] Arrival date & time: 03/09/18  2239     History   Chief Complaint Chief Complaint  Patient presents with  . Abdominal Pain    HPI Victoria Browning is a 42 y.o. female.   42 year old female presents to the emergency department for evaluation of abdominal pain, nausea, vomiting.  She reports that symptoms began with burning with urination a few days ago.  This has improved with increased oral hydration, though she continues to experience urinary frequency.  She developed nausea and vomiting yesterday with multiple episodes of emesis at work prior to arrival.  Reports subjective fever prior to arrival as well as chills.  Had a fever of 103 F in triage.  No medications taken prior to arrival for symptoms.  No hematemesis, bowel changes.      History reviewed. No pertinent past medical history.  There are no active problems to display for this patient.   Past Surgical History:  Procedure Laterality Date  . ABDOMINAL SURGERY       OB History   None      Home Medications    Prior to Admission medications   Medication Sig Start Date End Date Taking? Authorizing Provider  cephALEXin (KEFLEX) 500 MG capsule Take 1 capsule (500 mg total) by mouth 4 (four) times daily. 03/10/18   Antony MaduraHumes, Rohen Kimes, PA-C  cyclobenzaprine (FLEXERIL) 10 MG tablet Take 1 tablet (10 mg total) by mouth 2 (two) times daily as needed for muscle spasms. Patient not taking: Reported on 05/22/2016 04/11/16   Roxy HorsemanBrowning, Robert, PA-C  HYDROcodone-acetaminophen (NORCO/VICODIN) 5-325 MG tablet Take 1-2 tablets by mouth every 4 (four) hours as needed. 05/22/16   Nelva NayBeaton, Robert, MD  ibuprofen (ADVIL,MOTRIN) 600 MG tablet Take 1 tablet (600 mg total) by mouth every 6 (six) hours as needed for fever, mild pain or moderate pain. 03/10/18   Antony MaduraHumes, Deatrice Spanbauer, PA-C  naproxen (NAPROSYN) 500 MG tablet Take 1 tablet (500 mg total) by mouth 2 (two)  times daily. Patient not taking: Reported on 05/22/2016 03/13/16   Elpidio AnisUpstill, Shari, PA-C  predniSONE (DELTASONE) 20 MG tablet Take 2 tablets (40 mg total) by mouth daily. Patient not taking: Reported on 05/22/2016 04/11/16   Roxy HorsemanBrowning, Robert, PA-C  promethazine (PHENERGAN) 25 MG tablet Take 1 tablet (25 mg total) by mouth every 6 (six) hours as needed for nausea or vomiting. 03/10/18   Antony MaduraHumes, Maximo Spratling, PA-C    Family History No family history on file.  Social History Social History   Tobacco Use  . Smoking status: Current Every Day Smoker    Types: Cigarettes  . Smokeless tobacco: Never Used  Substance Use Topics  . Alcohol use: Yes    Comment: socially  . Drug use: Yes    Types: Marijuana     Allergies   Patient has no known allergies.   Review of Systems Review of Systems Ten systems reviewed and are negative for acute change, except as noted in the HPI.    Physical Exam Updated Vital Signs BP 96/69   Pulse 80   Temp 99.8 F (37.7 C) (Oral)   Resp 16   SpO2 100%   Physical Exam  Constitutional: She is oriented to person, place, and time. She appears well-developed and well-nourished. No distress.  Nontoxic appearing and in NAD  HENT:  Head: Normocephalic and atraumatic.  Eyes: Conjunctivae and EOM are normal. No scleral icterus.  Neck: Normal range of motion.  Cardiovascular: Normal  rate, regular rhythm and intact distal pulses.  Not tachycardic as noted in triage.  Pulmonary/Chest: Effort normal. No stridor. No respiratory distress.  Respirations even and unlabored  Abdominal: Soft. She exhibits no distension and no mass. There is no tenderness. There is no guarding.  Soft, nontender. No CVA TTP. No peritoneal signs.  Musculoskeletal: Normal range of motion.  Neurological: She is alert and oriented to person, place, and time. She exhibits normal muscle tone. Coordination normal.  Skin: Skin is warm and dry. No rash noted. She is not diaphoretic. No erythema. No  pallor.  Psychiatric: She has a normal mood and affect. Her behavior is normal.  Nursing note and vitals reviewed.    ED Treatments / Results  Labs (all labs ordered are listed, but only abnormal results are displayed) Labs Reviewed  COMPREHENSIVE METABOLIC PANEL - Abnormal; Notable for the following components:      Result Value   CO2 21 (*)    Glucose, Bld 151 (*)    Creatinine, Ser 1.11 (*)    Calcium 8.7 (*)    Total Bilirubin 1.6 (*)    All other components within normal limits  CBC - Abnormal; Notable for the following components:   WBC 14.7 (*)    All other components within normal limits  URINALYSIS, ROUTINE W REFLEX MICROSCOPIC - Abnormal; Notable for the following components:   APPearance HAZY (*)    Hgb urine dipstick LARGE (*)    Ketones, ur 20 (*)    Protein, ur 30 (*)    Leukocytes, UA LARGE (*)    RBC / HPF >50 (*)    WBC, UA >50 (*)    Bacteria, UA FEW (*)    All other components within normal limits  I-STAT CG4 LACTIC ACID, ED - Abnormal; Notable for the following components:   Lactic Acid, Venous 2.33 (*)    All other components within normal limits  URINE CULTURE  LIPASE, BLOOD  I-STAT BETA HCG BLOOD, ED ([facility], [facility], [facility] ONLY)  I-STAT CG4 LACTIC ACID, ED    EKG None  Radiology US Renal  Result Date: 03/10/2018 CLINICAL DATA:  Initial evaluation for UTI, back pain. EXAM: RENAL / URINARY TRACT ULTRASOUND COMPLETE COMPARISON:  None. FINDINGS: Right Kidney: Length: 13.0 cm. Echogenicity within normal limits. No mass or hydronephrosis visualized. Left Kidney: Length: 12.2 cm. Echogenicity within normal limits. No hydronephrosis. 1.1 x 1.0 x 0.9 cm cyst present at the upper pole. Bladder: Appears normal for degree of bladder distention. IMPRESSION: 1. No hydronephrosis. 2. 1.1 cm simple cyst at the upper pole left kidney. Electronically Signed   By: Rise Mu M.D.   On: 03/10/2018 04:07    Procedures Procedures (including critical care  time)  Medications Ordered in ED Medications  acetaminophen (TYLENOL) tablet 650 mg (650 mg Oral Given 03/09/18 2306)  sodium chloride 0.9 % bolus 1,000 mL (0 mLs Intravenous Stopped 03/10/18 0456)  cefTRIAXone (ROCEPHIN) 1 g in sodium chloride 0.9 % 100 mL IVPB (0 g Intravenous Stopped 03/10/18 0455)  ketorolac (TORADOL) 30 MG/ML injection 30 mg (30 mg Intravenous Given 03/10/18 0251)    4:40 AM Ultrasound reviewed.  No hydronephrosis.  No distinct, severe flank pain.  Believe hematuria to be secondary to infection rather than kidney stone.  Patient also has no history of stones.   Initial Impression / Assessment and Plan / ED Course  I have reviewed the triage vital signs and the nursing notes.  Pertinent labs & imaging results that were  available during my care of the patient were reviewed by me and considered in my medical decision making (see chart for details).     42 year old female presents to the emergency department for dysuria with urinary frequency.  She has developed fever as well as nausea and vomiting.  Urinalysis today consistent with urinary tract infection given pyuria.  There is also evidence of gross hematuria, though patient denies noticing blood in her urine prior to arrival.  Fever of 103 F on arrival has improved with antipyretics.  Tachycardia has resolved with IV fluids.  Lactate has also cleared with hydration.  The patient was given IV Rocephin.  Urine culture is pending.  She is clinically very well-appearing and nontoxic.  Has had symptomatic improvement since arrival in the ED.  Have counseled on the use of home antibiotics for management.  She has been told to return for any new or concerning symptoms.   Vitals:   03/09/18 2302 03/10/18 0115 03/10/18 0456 03/10/18 0500  BP: 138/78 104/75  96/69  Pulse: (!) 126 99 82 80  Resp: 17 17 16    Temp: (!) 103 F (39.4 C) 99.8 F (37.7 C)    TempSrc: Oral Oral    SpO2: 100% 99% 100% 100%    Final Clinical  Impressions(s) / ED Diagnoses   Final diagnoses:  Pyelonephritis  Microscopic hematuria    ED Discharge Orders         Ordered    ibuprofen (ADVIL,MOTRIN) 600 MG tablet  Every 6 hours PRN     03/10/18 0458    cephALEXin (KEFLEX) 500 MG capsule  4 times daily     03/10/18 0458    promethazine (PHENERGAN) 25 MG tablet  Every 6 hours PRN     03/10/18 0458           Antony MaduraHumes, Berline Semrad, PA-C 03/10/18 0507    Melene PlanFloyd, Dan, DO 03/10/18 [id number]

## 2018-03-10 NOTE — ED Notes (Signed)
Patient left at this time with all belongings. 

## 2018-03-10 NOTE — Discharge Instructions (Signed)
Take Keflex as prescribed until finished.  Do not miss any doses or stop this antibiotic early.  We advised the use of ibuprofen for fever and pain management.  You may alternate this with Tylenol as needed.  Take Phenergan for nausea as prescribed.  Follow-up with your primary care doctor to ensure resolution of symptoms.

## 2018-03-11 LAB — URINE CULTURE: CULTURE: NO GROWTH

## 2020-11-03 ENCOUNTER — Ambulatory Visit (HOSPITAL_COMMUNITY)
Admission: EM | Admit: 2020-11-03 | Discharge: 2020-11-03 | Disposition: A | Discharge status: Home / Self Care | Payer: BC Managed Care – PPO | Attending: Emergency Medicine | Admitting: Emergency Medicine

## 2020-11-03 ENCOUNTER — Encounter (HOSPITAL_COMMUNITY): Payer: Self-pay

## 2020-11-03 ENCOUNTER — Other Ambulatory Visit: Payer: Self-pay

## 2020-11-03 ENCOUNTER — Telehealth (HOSPITAL_COMMUNITY): Payer: Self-pay

## 2020-11-03 DIAGNOSIS — M778 Other enthesopathies, not elsewhere classified: Secondary | ICD-10-CM | POA: Diagnosis not present

## 2020-11-03 MED ORDER — NAPROXEN 500 MG PO TABS: 500.0000 mg | ORAL_TABLET | Freq: Two times a day (BID) | ORAL | 0 refills | Status: AC

## 2020-11-03 MED ORDER — NAPROXEN 500 MG PO TABS: 500.0000 mg | ORAL_TABLET | Freq: Two times a day (BID) | ORAL | 0 refills | Status: DC

## 2020-11-03 MED ORDER — CYCLOBENZAPRINE HCL 10 MG PO TABS: 10.0000 mg | ORAL_TABLET | Freq: Every evening | ORAL | 0 refills | Status: AC | PRN

## 2020-11-03 MED ORDER — CYCLOBENZAPRINE HCL 10 MG PO TABS: 10.0000 mg | ORAL_TABLET | Freq: Every evening | ORAL | 0 refills | Status: DC | PRN

## 2020-11-03 NOTE — Telephone Encounter (Signed)
Nurse spoke with patient to make her aware that her medications have been re sent to pharmacy.  Melony Overly, RN

## 2020-11-03 NOTE — ED Triage Notes (Signed)
Pt presents with recurrent right arm, wrist, & hand weakness, pain, and numbness.

## 2020-11-03 NOTE — Discharge Instructions (Signed)
Use naproxen twice a day for the next 3-5 days then as needed  Can use muscle relaxer at bedtime as needed, may make you drowsy, can take half of a pill if this occurs  Can wear brace as needed for support   For persistent symptoms can follow up with orthopedics or primary care
